# Patient Record
Sex: Male | Born: 1990 | Race: White | Hispanic: No | Marital: Single | Smoking: Never smoker
Health system: Southern US, Community
[De-identification: ages and names within clinical notes are randomized; demographics above are authoritative.]

## PROBLEM LIST (undated history)

## (undated) DIAGNOSIS — A6920 Lyme disease, unspecified: Secondary | ICD-10-CM

---
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
## 2010-09-03 ENCOUNTER — Other Ambulatory Visit: Payer: Self-pay | Admitting: Internal Medicine

## 2010-09-03 ENCOUNTER — Encounter: Payer: Self-pay | Admitting: Internal Medicine

## 2010-09-03 ENCOUNTER — Ambulatory Visit
Admission: RE | Admit: 2010-09-03 | Discharge: 2010-09-03 | Payer: Self-pay | Source: Home / Self Care | Attending: Internal Medicine | Admitting: Internal Medicine

## 2010-09-03 LAB — LIPID PANEL
Cholesterol: 194 mg/dL (ref 0–200)
HDL: 38.1 mg/dL — ABNORMAL LOW (ref >39.00)
LDL Cholesterol: 132 mg/dL — ABNORMAL HIGH (ref 0–99)
Total CHOL/HDL Ratio: 5
Triglycerides: 120 mg/dL (ref 0.0–149.0)
VLDL: 24 mg/dL (ref 0.0–40.0)

## 2010-10-03 NOTE — Assessment & Plan Note (Signed)
Summary: BRAND NEW PT/TO EST/OK PER BOTH DRS/PT REQ TETANUS SHOT/CJR   Vital Signs:  Patient profile:   20 year old male Height:      66 inches Weight:      169 pounds BMI:     27.38 O2 Sat:      98 % on Room air Temp:     98.0 degrees F oral Pulse rate:   88 / minute BP sitting:   110 / 70  (right arm) Cuff size:   regular  Vitals Entered By: Duard Brady LPN (September 03, 2010 2:42 PM)  O2 Flow:  Room air CC: new to establish - c/o on/off back muscle spasms Is Patient Diabetic? No   CC:  new to establish - c/o on/off back muscle spasms.  History of Present Illness: 20 y/o patient who is in today to establish with our practice.  He has enjoyed excellent health and has never been hospitalized.  He is a nonsmoker and takes no medications.  His only complaint is some hamstring discomfort.  That has already resolved.  Allergies (verified): No Known Drug Allergies  Past History:  Past Medical History: Lyme Disease Ear Infectionsis one there are chart.  history of strep pharyngitis  Past Surgical History: Reviewed history from 05/31/2007 and no changes required. Denies surgical history  Family History: Reviewed history from 05/31/2007 and no changes required. Family History Hypertension Family History of Allergies Family History of PVD  mother status post aortofemoral bypass history of atherosclerosis and hypertension father history of type 2 diabetes at age 25; high blood pressure  Social History: Reviewed history from 05/31/2007 and no changes required. 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    Physical Exam  General:  Well-developed,well-nourished,in no acute distress; alert,appropriate and cooperative throughout examination; 130/9 ( 110/70.  On arrival) Head:  Normocephalic and atraumatic without obvious abnormalities. No apparent alopecia or balding. Eyes:  No corneal or conjunctival inflammation noted. EOMI. Perrla. Funduscopic exam benign, without hemorrhages, exudates or papilledema. Vision grossly normal. Ears:  External ear exam shows no significant lesions or deformities.  Otoscopic examination reveals clear canals, tympanic membranes are intact bilaterally without bulging, retraction, inflammation or discharge. Hearing is grossly normal bilaterally. Nose:  External nasal examination shows no deformity or inflammation. Nasal mucosa are pink and moist without lesions or exudates. Mouth:  Oral mucosa and oropharynx without lesions or exudates.  Teeth in good repair. Neck:  No deformities, masses, or tenderness noted. Chest Wall:  No deformities, masses, tenderness or gynecomastia noted. Breasts:  No masses or gynecomastia noted Lungs:  Normal respiratory effort, chest expands symmetrically. Lungs are clear to auscultation, no crackles or wheezes. Heart:  Normal rate and regular rhythm. S1 and S2 normal without gallop, murmur, click, rub or other extra sounds. Abdomen:  Bowel sounds positive,abdomen soft and non-tender without masses, organomegaly or hernias noted. Msk:  No deformity or scoliosis noted of thoracic or lumbar spine.   Pulses:  R and L carotid,radial,femoral,dorsalis pedis and posterior tibial pulses are full and equal bilaterally Extremities:  No clubbing, cyanosis, edema, or deformity noted with normal full range  of motion of all joints.   Neurologic:  No cranial nerve deficits noted.  Station and gait are normal. Plantar reflexes are down-going bilaterally. DTRs are symmetrical throughout. Sensory, motor and coordinative functions appear intact. Skin:  large tattoos both upper arms;  Cervical Nodes:  No lymphadenopathy noted Axillary Nodes:  No palpable lymphadenopathy Inguinal Nodes:  No significant adenopathy Psych:  Cognition and judgment appear intact. Alert and cooperative with normal attention span and concentration. No apparent delusions, illusions, hallucinations   Impression & Recommendations:  Problem # 1:  Preventive Health Care (ICD-V70.0)  Orders: Venipuncture (16109) TLB-Lipid Panel (80061-LIPID)  Complete Medication List: 1)  No Current Rx Meds   Patient Instructions: 1)  Please schedule a follow-up appointment as needed. 2)  Limit your Sodium (Salt). 3)  It is important that you exercise regularly at least 20 minutes 5 times a week. If you develop chest pain, have severe difficulty breathing, or feel very tired , stop exercising immediately and seek medical attention.   Orders Added: 1)  New Patient 18-39 years [99385] 2)  Venipuncture [60454] 3)  TLB-Lipid Panel [[id number]]     Appended Document: Orders Update    Clinical Lists Changes  Orders: Added new Service order of Specimen Handling (09811) - Signed      Appended Document: BRAND NEW PT/TO EST/OK PER BOTH DRS/PT REQ TETANUS SHOT/CJR     Allergies: No Known Drug Allergies   Complete Medication List: 1)  No Current Rx Meds   Other Orders: Tdap => 39yrs IM (91478) Admin 1st Vaccine (29562)   Orders Added: 1)  Tdap => 74yrs IM [90715] 2)  Admin 1st Vaccine [13086]   Immunizations Administered:  Tetanus Vaccine:    Vaccine Type: Tdap    Site: right deltoid    Mfr: GlaxoSmithKline    Dose: 0.5 ml    Route: IM    Given by: Duard Brady LPN    Exp. Date: 06/20/2012    Lot #: [id number]    VIS given: 07/19/08 version given September 03, 2010.     Physician counseled: yes   Immunizations Administered:  Tetanus Vaccine:    Vaccine Type: Tdap    Site: right deltoid    Mfr: GlaxoSmithKline    Dose: 0.5 ml    Route: IM    Given by: Duard Brady LPN    Exp. Date: 06/20/2012    Lot #: [id number]    VIS given: 07/19/08 version given September 03, 2010.    Physician counseled: yes

## 2010-10-03 NOTE — Letter (Signed)
Summary: Patient Information form  Patient Information form   Imported By: Maryln Gottron 09/06/2010 09:06:49  _____________________________________________________________________  External Attachment:    Type:   Image     Comment:   External Document

## 2011-09-06 ENCOUNTER — Emergency Department (INDEPENDENT_AMBULATORY_CARE_PROVIDER_SITE_OTHER)
Admission: EM | Admit: 2011-09-06 | Discharge: 2011-09-06 | Disposition: A | Discharge status: Home / Self Care | Payer: 59 | Source: Home / Self Care | Attending: Family Medicine | Admitting: Family Medicine

## 2011-09-06 ENCOUNTER — Encounter: Payer: Self-pay | Admitting: Emergency Medicine

## 2011-09-06 DIAGNOSIS — H6120 Impacted cerumen, unspecified ear: Secondary | ICD-10-CM

## 2011-09-06 HISTORY — DX: Lyme disease, unspecified: A69.20

## 2011-09-06 NOTE — ED Notes (Signed)
Right ear fullness x 48 hours.

## 2011-09-06 NOTE — ED Provider Notes (Signed)
History     CSN: [id number]  Arrival date & time 09/06/11  1558   First MD Initiated Contact with Patient 09/06/11 1712      Chief Complaint  Patient presents with  . Ear Fullness     HPI Comments: Patient complains of his right ear feeling clogged for about 48 hours, with minimal sinus congestion.  No sore throat.  No fevers, chills, and sweats.  He feels well otherwise  Patient is a 21 y.o. male presenting with plugged ear sensation. The history is provided by the patient.  Ear Fullness This is a new problem. The current episode started 2 days ago. The problem occurs constantly. The problem has not changed since onset.The symptoms are relieved by nothing.    Past Medical History  Diagnosis Date  . Lyme disease     History reviewed. No pertinent past surgical history.  Family History  Problem Relation Age of Onset  . Hyperlipidemia Mother   . Hypertension Mother   . Diabetes Father     History  Substance Use Topics  . Smoking status: Never Smoker   . Smokeless tobacco: Not on file  . Alcohol Use: No      Review of Systems  All other systems reviewed and are negative.    Allergies  Review of patient's allergies indicates no known allergies.  Home Medications  No current outpatient prescriptions on file.  BP 132/79  Pulse 57  Temp(Src) 97.8 F (36.6 C) (Oral)  Resp 16  Ht 5\' 8"  (1.727 m)  Wt 165 lb (74.844 kg)  BMI 25.09 kg/m2  SpO2 99%  Physical Exam Nursing notes and Vital Signs reviewed. Appearance:  Patient appears healthy, stated age, and in no acute distress Eyes:  Pupils are equal, round, and reactive to light and accomodation.  Extraocular movement is intact.  Conjunctivae are not inflamed  Ears:  Left canal normal; right canal occluded with cerumen.  Left tympanic membrane normal.  Post lavage, the right tympanic membrane and canal are normal Nose:  Mildly congested turbinates.  No sinus tenderness.     Pharynx:  Normal Neck:  Supple.  No  adenopathy   ED Course  Procedures Right ear lavage successful     1. Cerumen impaction       MDM  Return prn        Donna Christen, MD 09/06/11 1800

## 2020-04-30 ENCOUNTER — Ambulatory Visit: Payer: Self-pay | Admitting: Medical-Surgical

## 2020-05-24 ENCOUNTER — Ambulatory Visit: Payer: Self-pay | Admitting: Medical-Surgical

## 2021-07-26 ENCOUNTER — Other Ambulatory Visit (HOSPITAL_BASED_OUTPATIENT_CLINIC_OR_DEPARTMENT_OTHER): Payer: Self-pay

## 2021-08-05 ENCOUNTER — Other Ambulatory Visit: Payer: Self-pay

## 2021-08-05 ENCOUNTER — Ambulatory Visit
Admission: RE | Admit: 2021-08-05 | Discharge: 2021-08-05 | Disposition: A | Discharge status: Home / Self Care | Payer: No Typology Code available for payment source | Source: Ambulatory Visit | Attending: Nurse Practitioner | Admitting: Nurse Practitioner

## 2021-08-05 ENCOUNTER — Other Ambulatory Visit: Payer: Self-pay | Admitting: Nurse Practitioner

## 2021-08-05 DIAGNOSIS — Z021 Encounter for pre-employment examination: Secondary | ICD-10-CM

## 2021-11-19 ENCOUNTER — Encounter (HOSPITAL_COMMUNITY): Payer: Self-pay

## 2021-11-19 ENCOUNTER — Ambulatory Visit (HOSPITAL_COMMUNITY)
Admission: EM | Admit: 2021-11-19 | Discharge: 2021-11-19 | Disposition: A | Discharge status: Home / Self Care | Payer: 59 | Attending: Physician Assistant | Admitting: Physician Assistant

## 2021-11-19 DIAGNOSIS — M722 Plantar fascial fibromatosis: Secondary | ICD-10-CM

## 2021-11-19 DIAGNOSIS — S90422A Blister (nonthermal), left great toe, initial encounter: Secondary | ICD-10-CM

## 2021-11-19 MED ORDER — BACITRACIN ZINC 500 UNIT/GM EX OINT
TOPICAL_OINTMENT | CUTANEOUS | Status: AC
  Filled 2021-11-19: qty 0.9

## 2021-11-19 NOTE — ED Provider Notes (Signed)
?[facility] ? ? ? ?CSN: [id number] ?Arrival date & time: 11/19/21  1744 ? ? ?  ? ?History   ?Chief Complaint ?Chief Complaint  ?Patient presents with  ? Foot Pain  ? ? ?HPI ?Jose Chang. is a 31 y.o. male.  ? ?Pt complains of left great toe pain that started a few weeks ago.  He reports gradual onset of left mid foot pain.  He denies known injury or trauma.  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  He has tried nothing for the pain.  Pain is worse with ambulation, applying pressure.   ? ? ?Past Medical History:  ?Diagnosis Date  ? Lyme disease   ? ? ?There are no problems to display for this patient. ? ? ?History reviewed. No pertinent surgical history. ? ? ? ? ?Home Medications   ? ?Prior to Admission medications   ?Not on File  ? ? ?Family History ?Family History  ?Problem Relation Age of Onset  ? Hyperlipidemia Mother   ? Hypertension Mother   ? Diabetes Father   ? ? ?Social History ?Social History  ? ?Tobacco Use  ? Smoking status: Never  ?Substance Use Topics  ? Alcohol use: No  ? Drug use: No  ? ? ? ?Allergies   ?Patient has no known allergies. ? ? ?Review of Systems ?Review of Systems  ?Constitutional:  Negative for chills and fever.  ?HENT:  Negative for ear pain and sore throat.   ?Eyes:  Negative for pain and visual disturbance.  ?Respiratory:  Negative for cough and shortness of breath.   ?Cardiovascular:  Negative for chest pain and palpitations.  ?Gastrointestinal:  Negative for abdominal pain and vomiting.  ?Genitourinary:  Negative for dysuria and hematuria.  ?Musculoskeletal:  Positive for arthralgias (left foot pain, left great toe pain). Negative for back pain.  ?Skin:  Negative for color change and rash.  ?Neurological:  Negative for seizures and syncope.  ?All other systems reviewed and are negative. ? ? ?Physical Exam ?Triage Vital Signs ?ED Triage Vitals  ?Enc Vitals Group  ?   BP 11/19/21 1837 124/76  ?   Pulse Rate 11/19/21 1837 62  ?   Resp 11/19/21 1837 16  ?    Temp 11/19/21 1837 98.6 ?F (37 ?C)  ?   Temp Source 11/19/21 1837 Oral  ?   SpO2 11/19/21 1837 100 %  ?   Weight --   ?   Height --   ?   Head Circumference --   ?   Peak Flow --   ?   Pain Score 11/19/21 1910 0  ?   Pain Loc --   ?   Pain Edu? --   ?   Excl. in GC? --   ? ?No data found. ? ?Updated Vital Signs ?BP 124/76 (BP Location: Left Arm)   Pulse 62   Temp 98.6 ?F (37 ?C) (Oral)   Resp 16   SpO2 100%  ? ?Visual Acuity ?Right Eye Distance:   ?Left Eye Distance:   ?Bilateral Distance:   ? ?Right Eye Near:   ?Left Eye Near:    ?Bilateral Near:    ? ?Physical Exam ?Vitals and nursing note reviewed.  ?Constitutional:   ?   General: He is not in acute distress. ?   Appearance: He is well-developed.  ?HENT:  ?   Head: Normocephalic and atraumatic.  ?Eyes:  ?   Conjunctiva/sclera: Conjunctivae normal.  ?Cardiovascular:  ?  Rate and Rhythm: Normal rate and regular rhythm.  ?   Heart sounds: No murmur heard. ?Pulmonary:  ?   Effort: Pulmonary effort is normal. No respiratory distress.  ?   Breath sounds: Normal breath sounds.  ?Abdominal:  ?   Palpations: Abdomen is soft.  ?   Tenderness: There is no abdominal tenderness.  ?Musculoskeletal:     ?   General: No swelling.  ?   Cervical back: Neck supple.  ?   Right foot: Normal.  ?   Left foot: Normal range of motion. Tenderness (mid foot tenderness to palpation) present. No swelling, deformity or bony tenderness.  ?   Comments: Blister noted to plantar surface of left great toe  ?Skin: ?   General: Skin is warm and dry.  ?   Capillary Refill: Capillary refill takes less than 2 seconds.  ?Neurological:  ?   Mental Status: He is alert.  ?Psychiatric:     ?   Mood and Affect: Mood normal.  ? ? ? ?UC Treatments / Results  ?Labs ?(all labs ordered are listed, but only abnormal results are displayed) ?Labs Reviewed - No data to display ? ?EKG ? ? ?Radiology ?No results found. ? ?Procedures ?Incision and Drainage ? ?Date/Time: 11/19/2021 7:48 PM ?Performed by: Ward,  Tylene FantasiaJessica Z, PA-C ?Authorized by: Ward, Tylene FantasiaJessica Z, PA-C  ? ?Consent:  ?  Consent obtained:  Verbal ?  Consent given by:  Patient ?  Risks, benefits, and alternatives were discussed: yes   ?  Risks discussed:  Infection ?  Alternatives discussed:  No treatment ?Universal protocol:  ?  Procedure explained and questions answered to patient or proxy's satisfaction: no   ?  Patient identity confirmed:  Verbally with patient ?Location:  ?  Type:  Fluid collection ?  Size:  1 cm x 1 cm ?  Location:  Lower extremity ?  Lower extremity location:  Toe ?  Toe location:  L big toe ?Pre-procedure details:  ?  Skin preparation:  Chlorhexidine with alcohol ?Sedation:  ?  Sedation type:  None ?Anesthesia:  ?  Anesthesia method:  None ?Procedure type:  ?  Complexity:  Simple ?Procedure details:  ?  Needle aspiration: yes   ?  Needle size:  18 G ?  Incision types:  Stab incision ?  Incision depth:  Dermal ?  Drainage:  Purulent ?  Drainage amount:  Scant ?  Wound treatment:  Wound left open ?  Packing materials:  None ?Post-procedure details:  ?  Procedure completion:  Tolerated (including critical care time) ? ?Medications Ordered in UC ?Medications - No data to display ? ?Initial Impression / Assessment and Plan / UC Course  ?I have reviewed the triage vital signs and the nursing notes. ? ?Pertinent labs & imaging results that were available during my care of the patient were reviewed by me and considered in my medical decision making (see chart for details). ? ?  ? ?Blister to left great toe, drained in clinic today.  Advise warm soaks, antibiotic ointment.  ? ?Left foot pain, plantar fasciitis.  Discussed stretching, rest, ice. ? ?Advise follow up with PCP if no improvement.  ?Final Clinical Impressions(s) / UC Diagnoses  ? ?Final diagnoses:  ?Blister of left great toe, initial encounter  ?Plantar fasciitis of left foot  ? ? ? ?Discharge Instructions   ? ?  ?Recommend warm soaks, left great toe ?Keep clean and dry, apply antibiotic  ointment ?Recommend daily stretching for foot pain, ibuprofen  as needed, ice as needed ?Return if symptoms become worse ? ? ?ED Prescriptions   ?None ?  ? ?PDMP not reviewed this encounter. ?  ?Ward, Tylene Fantasia, PA-C ?11/19/21 1950 ? ?

## 2021-11-19 NOTE — Discharge Instructions (Signed)
Recommend warm soaks, left great toe ?Keep clean and dry, apply antibiotic ointment ?Recommend daily stretching for foot pain, ibuprofen as needed, ice as needed ?Return if symptoms become worse ?

## 2021-11-19 NOTE — ED Triage Notes (Signed)
Pt reports left foot pain x 1-2 weeks.No known injury at this time. ?

## 2022-05-20 IMAGING — CR DG CHEST 1V
1 series · 1 of 1 positions shown · non-contrast
Comparison: None.

CLINICAL DATA: Pre-employment physical examination.

EXAM:
CHEST  1 VIEW

[w chest pa]
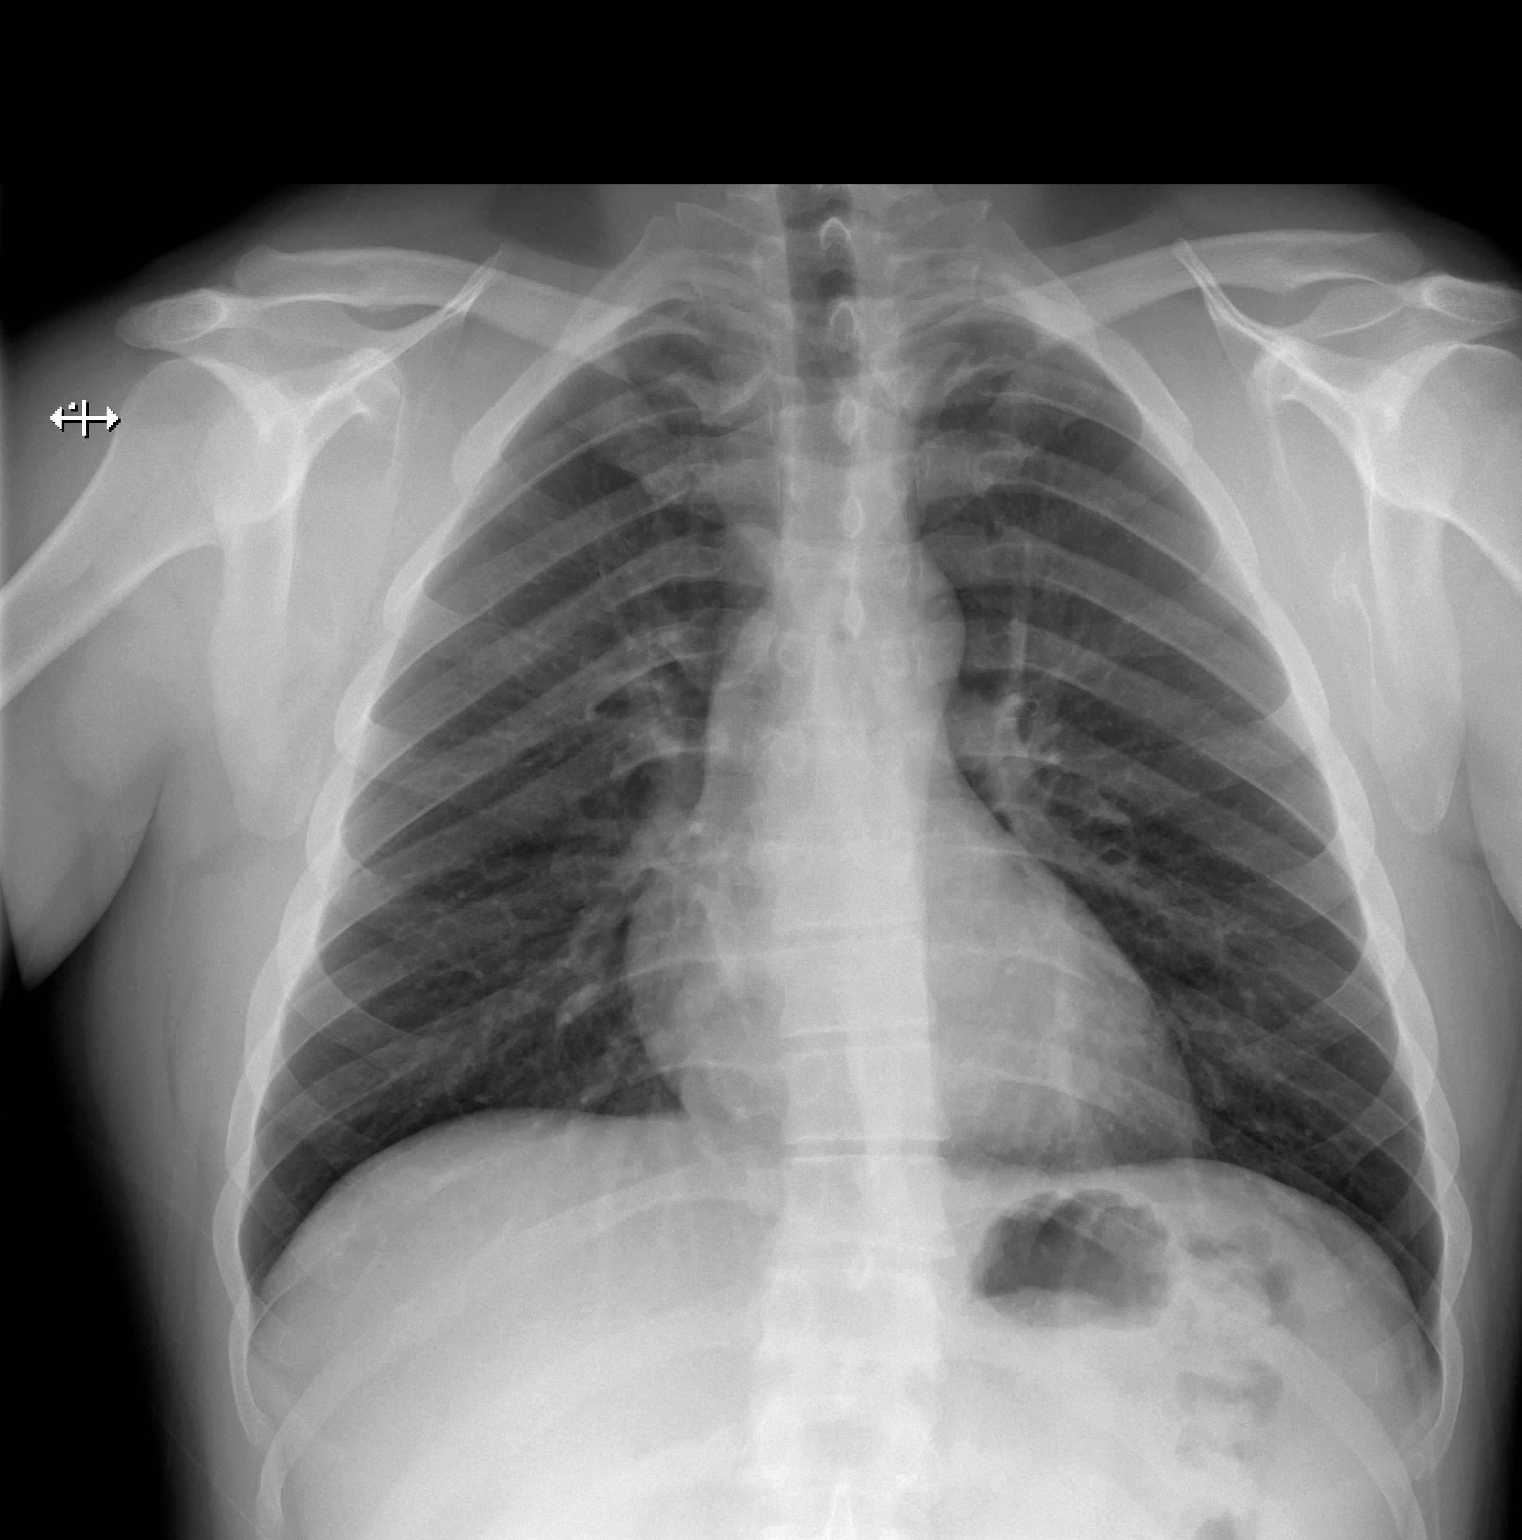

[1 of 1 positions shown; findings below may reference images not displayed]

FINDINGS: The cardiomediastinal silhouette is within normal limits. The lungs
are well inflated and clear. There is no evidence of pleural
effusion or pneumothorax. No acute osseous abnormality is
identified.
IMPRESSION: No active disease.
